# Patient Record
Sex: Female | Born: 1950 | Race: White | Hispanic: No | Smoking: Current every day smoker
Health system: Southern US, Community
[De-identification: ages and names within clinical notes are randomized; demographics above are authoritative.]

## PROBLEM LIST (undated history)

## (undated) DIAGNOSIS — B379 Candidiasis, unspecified: Secondary | ICD-10-CM

## (undated) DIAGNOSIS — E785 Hyperlipidemia, unspecified: Secondary | ICD-10-CM

## (undated) DIAGNOSIS — I10 Essential (primary) hypertension: Secondary | ICD-10-CM

## (undated) HISTORY — DX: Candidiasis, unspecified: B37.9

## (undated) HISTORY — DX: Hyperlipidemia, unspecified: E78.5

## (undated) HISTORY — PX: SPINE SURGERY: SHX786

## (undated) HISTORY — PX: ABDOMINAL HYSTERECTOMY: SHX81

## (undated) HISTORY — DX: Essential (primary) hypertension: I10

---

## 2000-11-15 ENCOUNTER — Other Ambulatory Visit: Admission: RE | Admit: 2000-11-15 | Discharge: 2000-11-15 | Payer: Self-pay | Admitting: Family Medicine

## 2001-12-22 ENCOUNTER — Encounter: Payer: Self-pay | Admitting: Family Medicine

## 2001-12-22 ENCOUNTER — Ambulatory Visit (HOSPITAL_COMMUNITY): Admission: RE | Admit: 2001-12-22 | Discharge: 2001-12-22 | Payer: Self-pay | Admitting: Family Medicine

## 2001-12-26 ENCOUNTER — Other Ambulatory Visit: Admission: RE | Admit: 2001-12-26 | Discharge: 2001-12-26 | Payer: Self-pay | Admitting: Family Medicine

## 2002-02-13 ENCOUNTER — Other Ambulatory Visit: Admission: RE | Admit: 2002-02-13 | Discharge: 2002-02-13 | Payer: Self-pay | Admitting: Family Medicine

## 2003-01-18 ENCOUNTER — Encounter: Payer: Self-pay | Admitting: Family Medicine

## 2003-01-18 ENCOUNTER — Ambulatory Visit (HOSPITAL_COMMUNITY): Admission: RE | Admit: 2003-01-18 | Discharge: 2003-01-18 | Payer: Self-pay | Admitting: Family Medicine

## 2003-01-21 ENCOUNTER — Other Ambulatory Visit: Admission: RE | Admit: 2003-01-21 | Discharge: 2003-01-21 | Payer: Self-pay | Admitting: Family Medicine

## 2003-01-24 ENCOUNTER — Encounter: Payer: Self-pay | Admitting: Family Medicine

## 2003-01-24 ENCOUNTER — Encounter: Admission: RE | Admit: 2003-01-24 | Discharge: 2003-01-24 | Payer: Self-pay | Admitting: Family Medicine

## 2004-01-28 ENCOUNTER — Encounter: Admission: RE | Admit: 2004-01-28 | Discharge: 2004-01-28 | Payer: Self-pay | Admitting: Family Medicine

## 2004-01-30 ENCOUNTER — Other Ambulatory Visit: Admission: RE | Admit: 2004-01-30 | Discharge: 2004-01-30 | Payer: Self-pay | Admitting: Family Medicine

## 2004-03-12 ENCOUNTER — Encounter: Admission: RE | Admit: 2004-03-12 | Discharge: 2004-03-12 | Payer: Self-pay | Admitting: Family Medicine

## 2004-03-17 ENCOUNTER — Ambulatory Visit: Admission: RE | Admit: 2004-03-17 | Discharge: 2004-03-17 | Payer: Self-pay | Admitting: Family Medicine

## 2005-02-23 ENCOUNTER — Encounter: Admission: RE | Admit: 2005-02-23 | Discharge: 2005-02-23 | Payer: Self-pay | Admitting: Family Medicine

## 2005-03-02 ENCOUNTER — Other Ambulatory Visit: Admission: RE | Admit: 2005-03-02 | Discharge: 2005-03-02 | Payer: Self-pay | Admitting: Family Medicine

## 2006-02-25 ENCOUNTER — Encounter: Admission: RE | Admit: 2006-02-25 | Discharge: 2006-02-25 | Payer: Self-pay | Admitting: Family Medicine

## 2007-03-17 ENCOUNTER — Encounter: Admission: RE | Admit: 2007-03-17 | Discharge: 2007-03-17 | Payer: Self-pay | Admitting: Family Medicine

## 2008-03-01 ENCOUNTER — Encounter: Admission: RE | Admit: 2008-03-01 | Discharge: 2008-03-01 | Payer: Self-pay | Admitting: Family Medicine

## 2008-04-03 ENCOUNTER — Ambulatory Visit (HOSPITAL_COMMUNITY): Admission: RE | Admit: 2008-04-03 | Discharge: 2008-04-04 | Payer: Self-pay | Admitting: Neurosurgery

## 2008-05-21 ENCOUNTER — Encounter: Admission: RE | Admit: 2008-05-21 | Discharge: 2008-05-21 | Payer: Self-pay | Admitting: Neurosurgery

## 2008-07-10 ENCOUNTER — Encounter: Admission: RE | Admit: 2008-07-10 | Discharge: 2008-07-10 | Payer: Self-pay | Admitting: Neurosurgery

## 2009-08-19 ENCOUNTER — Encounter: Admission: RE | Admit: 2009-08-19 | Discharge: 2009-08-19 | Payer: Self-pay | Admitting: Nephrology

## 2010-04-14 ENCOUNTER — Encounter: Admission: RE | Admit: 2010-04-14 | Discharge: 2010-04-14 | Payer: Self-pay | Admitting: Family Medicine

## 2010-10-25 ENCOUNTER — Encounter: Payer: Self-pay | Admitting: Neurosurgery

## 2011-02-16 NOTE — Op Note (Signed)
[name], [name]                 ACCOUNT NO.:  [id number]   MEDICAL RECORD NO.:  [id number]          PATIENT TYPE:  OIB   LOCATION:  3534                         FACILITY:  [facility]   PHYSICIAN:  Hilda Lias, M.D.   DATE OF BIRTH:  [birth year]   DATE OF PROCEDURE:  04/03/2008  DATE OF DISCHARGE:                               OPERATIVE REPORT   PREOPERATIVE DIAGNOSES:  C4-C5, C5-T6 spondylosis with chronic  radiculopathy.   POSTOPERATIVE DIAGNOSES:  C4-C5, C5-T6 spondylosis with chronic  radiculopathy.   PROCEDURE:  Anterior 4-5, 5-6 diskectomy, decompression of spinal cord,  interbody fusion with auto and allograft plate microscope.   SURGEON:  Hilda Lias, M.D.   ASSISTANT:  Danae Orleans. Venetia Maxon, MD.   CLINICAL HISTORY:  Stephanie Bolton is a 60 year old female complaining of neck  pain with both upper extremities associated with weakness.  X-ray shows  stenosis at the level of 4-5 and 5-6.  Surgery was advised in view of  failed conservative treatment.   PROCEDURE:  The patient was taken to the OR, and after intubation, the  left side of the neck was cleaned with DuraPrep.  Transverse incision  was made through the skin, subcutaneous tissue, platysma down to the  cervical area.  We found two anterior osteophytes.  Lateral cervical  spine showed that the needle was at 4-5.  From then on, we removed the  anterior osteophyte at 4-5 and 5-6 and we brought the microscope into  the area.  The anterior ligament which was less calcified was opened  using the drill.  We entered the disk space at 4-5 first which was quite  a bit of sclerotic.  We went to the posterior body of 4-5 and the  ligament was also incised in the midline.  Using the one 2 or 3-mm  Kerrison punch, decompression of the spinal cord as well post C5 nerve  root was achieved.  The same procedure was essentially done at the level  of L5-6 with good decompression.  Then two pieces of allograft, 7-mm  height, lordotic, with  autograft in the middle were inserted followed by  a plate using five screws.  Lateral cervical spine showed good position  of the graft at the plate.  We waited 5 minutes just to be assure that  we had good hemostasis.  Once this was accomplished, the wound was  closed with Vicryl and Steri-Strips.           ______________________________  Hilda Lias, M.D.     EB/MEDQ  D:  04/03/2008  T:  04/03/2008  Job:  [id number]

## 2011-07-01 LAB — BASIC METABOLIC PANEL
BUN: 2 — ABNORMAL LOW
Calcium: 9
GFR calc non Af Amer: >60
Glucose, Bld: 160 — ABNORMAL HIGH
Potassium: 3.2 — ABNORMAL LOW

## 2011-07-01 LAB — CBC
HCT: 41
MCHC: 34.3
Platelets: 301
RDW: 14.8

## 2011-10-04 ENCOUNTER — Ambulatory Visit: Payer: Managed Care, Other (non HMO)

## 2011-10-04 DIAGNOSIS — J209 Acute bronchitis, unspecified: Secondary | ICD-10-CM

## 2011-11-04 ENCOUNTER — Telehealth: Payer: Self-pay

## 2011-11-04 NOTE — Telephone Encounter (Signed)
.  [facility] PT IS REQUESTING A Z-PAC ANTIBOTICS - STILL NOT BETTER  LAST SEEN ON 10/04/11 CB [phone]

## 2011-11-04 NOTE — Telephone Encounter (Signed)
SPOKE WITH PT AND SHE STATES SHE IS A LOT BETTER THAN BEFORE BUT HER COUGH IS GETTING BAD AGAIN AND SHE HAS A LOT OF CONGESTION. I LET PT KNOW [facility] WORKS IN HER SYSTEM SEVEN DAYS AFTER THE LAST PILL. PT UNDERSTOOD BUT FEELS SHE FELT SHE WAITED TO CALL us BECAUSE SHE WAS GIVING IT TIME TO WORK. PLEASE ADVISE WHAT PT SHOULD DO NEXT. SHE USES [facility] IN [place]

## 2011-11-04 NOTE — Telephone Encounter (Signed)
Patient was last seen 1 month ago. Please have them start Mucinex and RTC for evaluation.  Stephanie Bolton

## 2011-11-04 NOTE — Telephone Encounter (Signed)
LMOM TO CB AND GET DETAILS ON HER CURRENT SXS

## 2011-11-05 NOTE — Telephone Encounter (Signed)
LMOM note from ryan

## 2012-03-01 ENCOUNTER — Other Ambulatory Visit: Payer: Self-pay | Admitting: Family Medicine

## 2012-03-01 DIAGNOSIS — Z1231 Encounter for screening mammogram for malignant neoplasm of breast: Secondary | ICD-10-CM

## 2012-03-29 ENCOUNTER — Ambulatory Visit
Admission: RE | Admit: 2012-03-29 | Discharge: 2012-03-29 | Disposition: A | Discharge status: Home / Self Care | Payer: Managed Care, Other (non HMO) | Source: Ambulatory Visit | Attending: Family Medicine | Admitting: Family Medicine

## 2012-03-29 DIAGNOSIS — Z1231 Encounter for screening mammogram for malignant neoplasm of breast: Secondary | ICD-10-CM

## 2013-02-16 ENCOUNTER — Other Ambulatory Visit (HOSPITAL_COMMUNITY)
Admission: RE | Admit: 2013-02-16 | Discharge: 2013-02-16 | Disposition: A | Discharge status: Home / Self Care | Payer: Managed Care, Other (non HMO) | Source: Ambulatory Visit | Attending: Gastroenterology | Admitting: Gastroenterology

## 2013-02-16 DIAGNOSIS — B379 Candidiasis, unspecified: Secondary | ICD-10-CM | POA: Insufficient documentation

## 2015-04-29 ENCOUNTER — Other Ambulatory Visit: Payer: Self-pay

## 2015-04-29 DIAGNOSIS — Z1231 Encounter for screening mammogram for malignant neoplasm of breast: Secondary | ICD-10-CM

## 2015-04-30 ENCOUNTER — Other Ambulatory Visit: Payer: Self-pay | Admitting: Family Medicine

## 2015-04-30 ENCOUNTER — Ambulatory Visit
Admission: RE | Admit: 2015-04-30 | Discharge: 2015-04-30 | Disposition: A | Discharge status: Home / Self Care | Payer: Managed Care, Other (non HMO) | Source: Ambulatory Visit | Attending: Family Medicine | Admitting: Family Medicine

## 2015-04-30 DIAGNOSIS — R200 Anesthesia of skin: Secondary | ICD-10-CM

## 2015-04-30 DIAGNOSIS — R52 Pain, unspecified: Secondary | ICD-10-CM

## 2015-04-30 DIAGNOSIS — M5489 Other dorsalgia: Secondary | ICD-10-CM

## 2015-04-30 DIAGNOSIS — R202 Paresthesia of skin: Secondary | ICD-10-CM

## 2015-05-05 ENCOUNTER — Ambulatory Visit
Admission: RE | Admit: 2015-05-05 | Discharge: 2015-05-05 | Disposition: A | Discharge status: Home / Self Care | Payer: Managed Care, Other (non HMO) | Source: Ambulatory Visit

## 2015-05-05 DIAGNOSIS — Z1231 Encounter for screening mammogram for malignant neoplasm of breast: Secondary | ICD-10-CM

## 2015-05-15 ENCOUNTER — Other Ambulatory Visit: Payer: Self-pay | Admitting: Neurosurgery

## 2015-05-15 DIAGNOSIS — M5415 Radiculopathy, thoracolumbar region: Secondary | ICD-10-CM

## 2015-05-15 DIAGNOSIS — M5412 Radiculopathy, cervical region: Secondary | ICD-10-CM

## 2015-05-23 ENCOUNTER — Ambulatory Visit
Admission: RE | Admit: 2015-05-23 | Discharge: 2015-05-23 | Disposition: A | Discharge status: Home / Self Care | Payer: Managed Care, Other (non HMO) | Source: Ambulatory Visit | Attending: Neurosurgery | Admitting: Neurosurgery

## 2015-05-23 VITALS — BP 123/71 | HR 87

## 2015-05-23 DIAGNOSIS — M5415 Radiculopathy, thoracolumbar region: Secondary | ICD-10-CM

## 2015-05-23 DIAGNOSIS — M5412 Radiculopathy, cervical region: Secondary | ICD-10-CM

## 2015-05-23 MED ORDER — IOHEXOL 300 MG/ML  SOLN
10.0000 mL | Freq: Once | INTRAMUSCULAR | Status: DC | PRN
  Administered 2015-05-23: 10 mL via INTRATHECAL

## 2015-05-23 MED ORDER — DIAZEPAM 5 MG PO TABS
5.0000 mg | ORAL_TABLET | Freq: Once | ORAL | Status: AC
  Administered 2015-05-23: 5 mg via ORAL

## 2015-05-23 MED ORDER — ONDANSETRON HCL 4 MG/2ML IJ SOLN
4.0000 mg | Freq: Once | INTRAMUSCULAR | Status: AC
  Administered 2015-05-23: 4 mg via INTRAMUSCULAR

## 2015-05-23 MED ORDER — MEPERIDINE HCL 100 MG/ML IJ SOLN
75.0000 mg | Freq: Once | INTRAMUSCULAR | Status: AC
  Administered 2015-05-23: 75 mg via INTRAMUSCULAR

## 2015-05-23 NOTE — Discharge Instructions (Signed)
Myelogram Discharge Instructions  1. Go home and rest quietly for the next 24 hours.  It is important to lie flat for the next 24 hours.  Get up only to go to the restroom.  You may lie in the bed or on a couch on your back, your stomach, your left side or your right side.  You may have one pillow under your head.  You may have pillows between your knees while you are on your side or under your knees while you are on your back.  2. DO NOT drive today.  Recline the seat as far back as it will go, while still wearing your seat belt, on the way home.  3. You may get up to go to the bathroom as needed.  You may sit up for 10 minutes to eat.  You may resume your normal diet and medications unless otherwise indicated.  Drink lots of extra fluids today and tomorrow.  4. The incidence of headache, nausea, or vomiting is about 5% (one in 20 patients).  If you develop a headache, lie flat and drink plenty of fluids until the headache goes away.  Caffeinated beverages may be helpful.  If you develop severe nausea and vomiting or a headache that does not go away with flat bed rest, call [phone].  5. You may resume normal activities after your 24 hours of bed rest is over; however, do not exert yourself strongly or do any heavy lifting tomorrow. If when you get up you have a headache when standing, go back to bed and force fluids for another 24 hours.  6. Call your physician for a follow-up appointment.  The results of your myelogram will be sent directly to your physician by the following day.  7. If you have any questions or if complications develop after you arrive home, please call [phone].  Discharge instructions have been explained to the patient.  The patient, or the person responsible for the patient, fully understands these instructions.  May resume Tramadol on Aug. 20, 2016, after 8:30 am.

## 2016-03-11 IMAGING — CR DG MYELOGRAPHY LUMBAR INJ MULTI REGION
7 series · 7 of 7 positions shown · non-contrast
Comparison: none

CLINICAL DATA: Low back pain. Neck pain. Symptom duration for
several months.
TECHNIQUE: Contiguous axial images were obtained through the Cervical and
Lumbar spine after the intrathecal infusion of infusion. Coronal and
sagittal reconstructions were obtained of the axial image sets.

[w cervical spine lat]
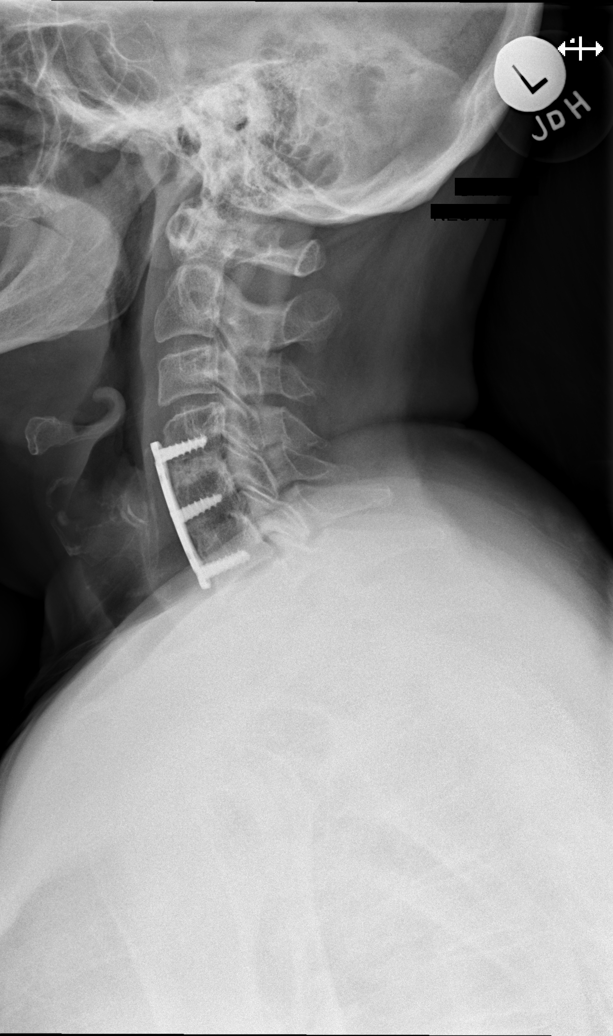

[w cervical spine flexion]
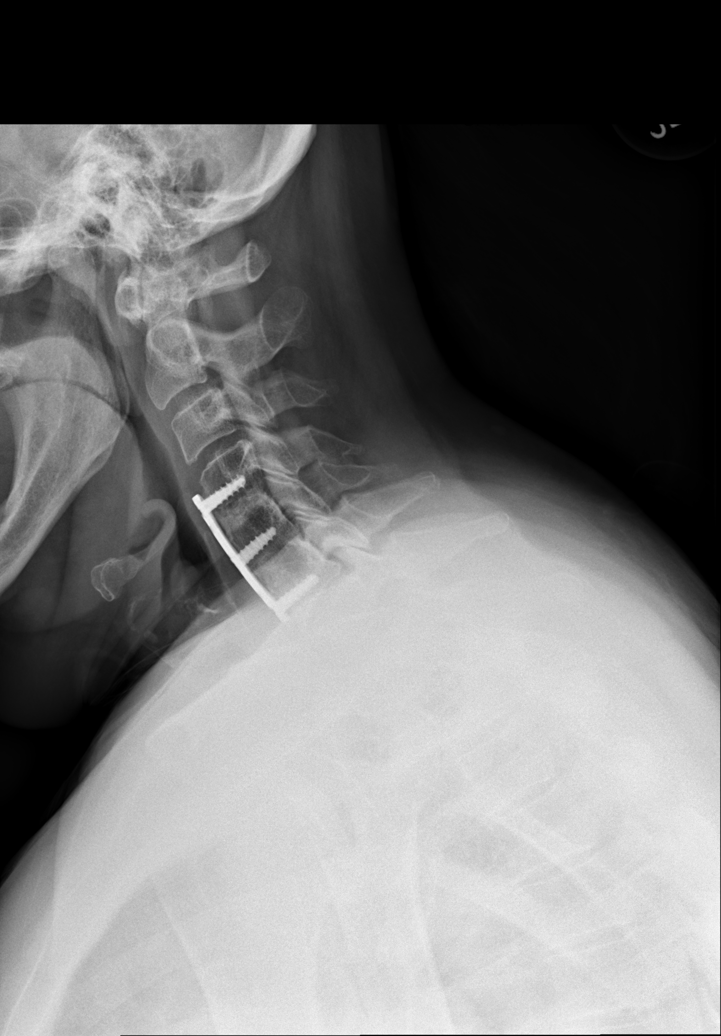

[w cervical spine extension]
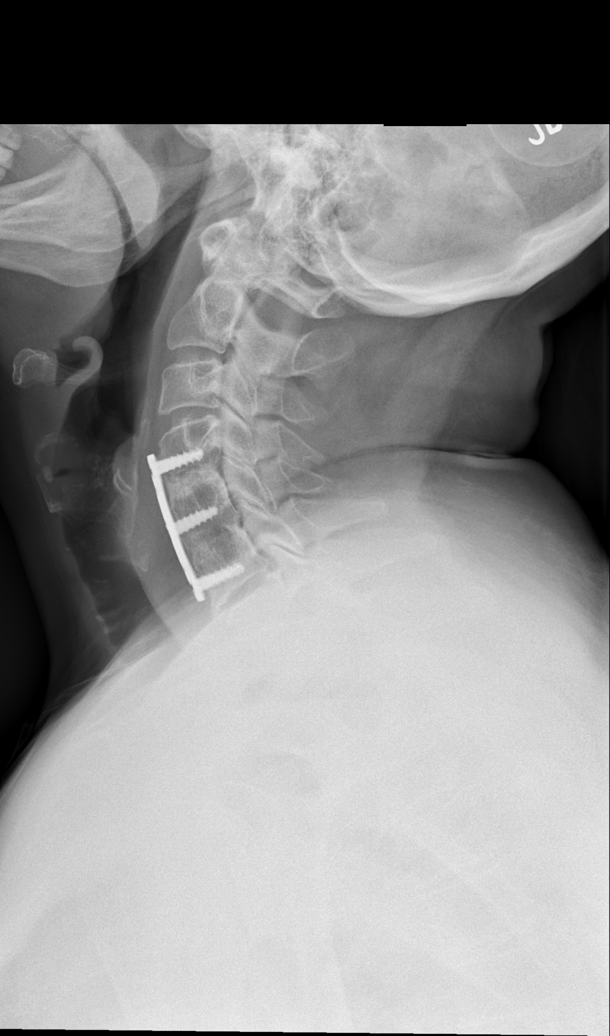

[w lumbar spine ap]
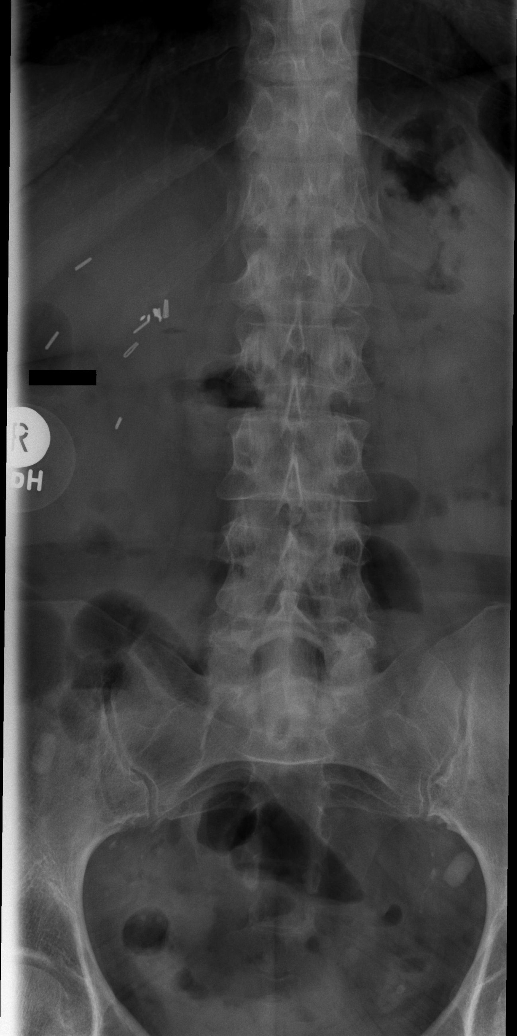

[w lumbar spine lat]
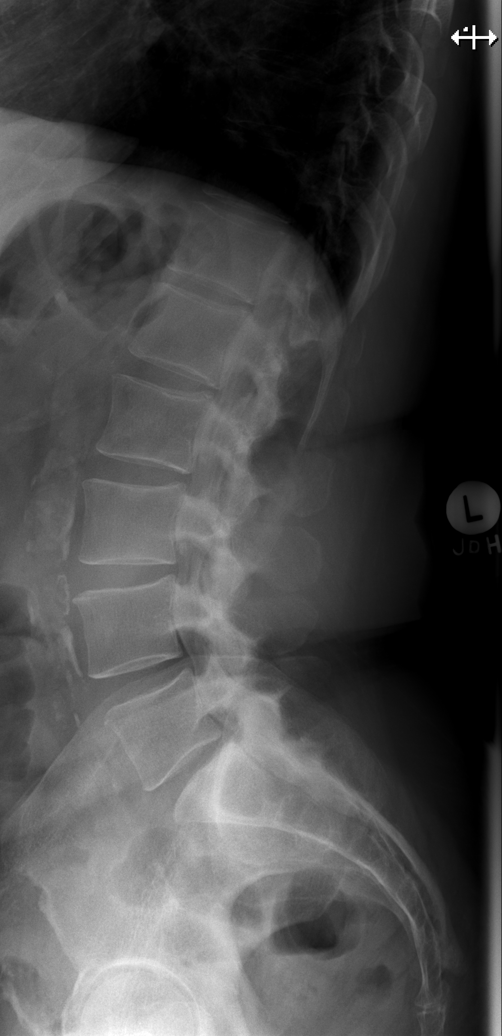

[w lumbar spine flexion]
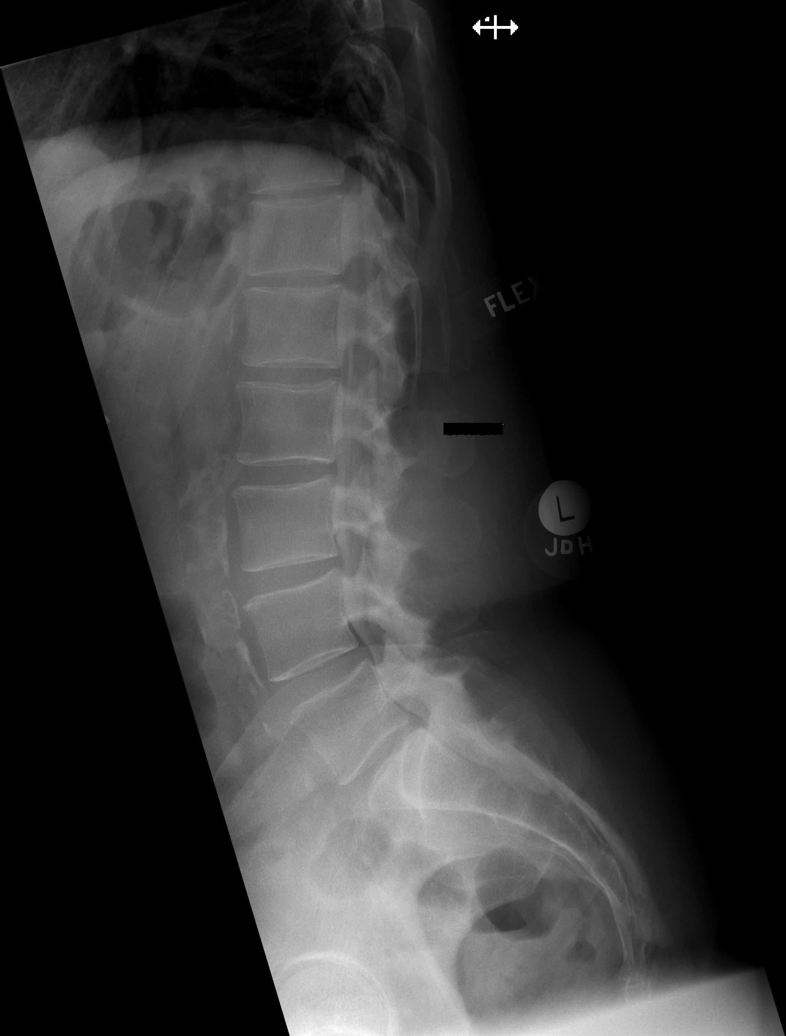

[w lumbar spine extension]
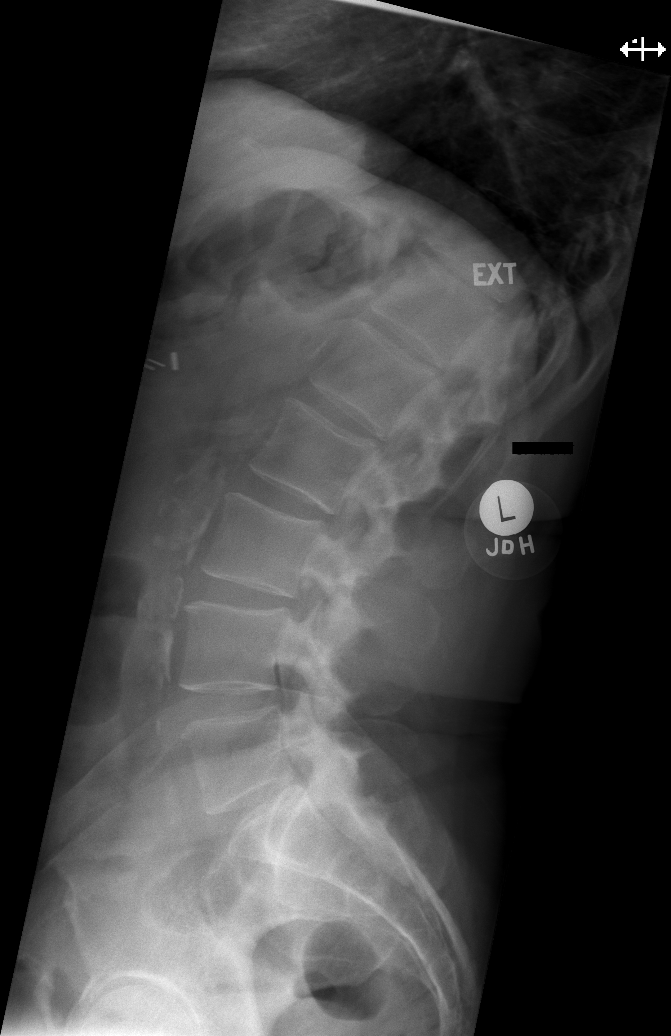

[7 of 7 positions shown; findings below may reference images not displayed]

FLUOROSCOPY TIME:  1 minutes and 58 seconds.

PROCEDURE:
LUMBAR PUNCTURE FOR CERVICAL AND LUMBAR MYELOGRAM

CERVICAL AND LUMBAR MYELOGRAM

CT CERVICAL MYELOGRAM

CT LUMBAR MYELOGRAM

After thorough discussion of risks and benefits of the procedure
including bleeding, infection, injury to nerves, blood vessels,
adjacent structures as well as headache and CSF leak, written and
oral informed consent was obtained. Consent was obtained by Dr. Brzeg
Lugermo.

Patient was positioned prone on the fluoroscopy table. Local
anesthesia was provided with 1% lidocaine without epinephrine after
prepped and draped in the usual sterile fashion. Puncture was
performed at L3-L4 using a 3 1/2 inch 22-gauge spinal needle via
midline approach. Using a single pass through the dura, the needle
was placed within the thecal sac, with return of clear CSF. 10 mL of
0mnipaque-3EE was injected into the thecal sac, with normal
opacification of the nerve roots and cauda equina consistent with
free flow within the subarachnoid space. The patient was then moved
to the trendelenburg position and contrast flowed into the Cervical
spine region.

I personally performed the lumbar puncture and administered the
intrathecal contrast. I also personally supervised acquisition of
the myelogram images.
FINDINGS: CERVICAL AND LUMBAR MYELOGRAM FINDINGS:

CERVICAL: Good opacification cervical subarachnoid space. Prior C4
through C6 fusion appears solid. Intact anterior plate and screws.
No visible spinal stenosis. Slight disc space narrowing at C6-C7
with central disc osteophyte complex. Standing flexion extension
demonstrate anatomic alignment without dynamic instability. No cord
compression.

LUMBAR: Good opacification lumbar subarachnoid space. Anatomic
alignment. No significant extradural defect. No nerve root cut off
or spinal stenosis.

Standing flexion extension demonstrate no dynamic instability. Mild
aortic atherosclerosis.

CT CERVICAL MYELOGRAM FINDINGS:

No prevertebral or paraspinous masses.  Anatomic alignment.

The individual disc spaces were examined as follows:

C2-3:  Normal.

C3-4: Mild bulge. RIGHT-sided uncinate spurring. Slight effacement
RIGHT C4 nerve root, does not correspond to a myelographic defect.

C4-5: Solid fusion. Slight residual uncinate spurring on the RIGHT
without significant foraminal narrowing.

C5-6:  Solid fusion.  No residual impingement.

C6-7: Slight disc space narrowing and central disc osteophyte
complex. A significant myelographic defect is not correlated.

C7-T1:  Unremarkable.

CT LUMBAR MYELOGRAM FINDINGS:

No prevertebral or paraspinous masses. No worrisome osseous lesions.
Large Schmorl's node projects inferiorly from L5-S1. Normal conus.
Non aneurysmal atherosclerotic calcification of the aorta. Surgical
clips RIGHT upper quadrant.

L1-L2:  Unremarkable.

L2-L3:  Unremarkable.

L3-L4: Mild stenosis related to congenital short pedicles. Slight
facet arthropathy. No impingement.

L4-L5: Slight stenosis related to mild bulge, congenital short
pedicles, and mild facet arthropathy. No impingement.

L5-S1:  Mild bulge.  No impingement.
IMPRESSION: Minor lumbar spondylosis. No areas of impingement or dynamic
instability.

Solid C4-C6 fusion. Minor adjacent segment disease at C6-7 without
myelographic defect or definite impingement. No dynamic instability.

## 2018-02-07 ENCOUNTER — Telehealth: Payer: Self-pay | Admitting: Acute Care

## 2018-02-07 DIAGNOSIS — F17210 Nicotine dependence, cigarettes, uncomplicated: Principal | ICD-10-CM

## 2018-02-07 DIAGNOSIS — Z122 Encounter for screening for malignant neoplasm of respiratory organs: Secondary | ICD-10-CM

## 2018-02-10 NOTE — Telephone Encounter (Signed)
Spoke with pt and scheduled SDMV 02/22/18 11:30 CT ordered Nothing further needed

## 2018-02-22 ENCOUNTER — Encounter: Payer: Self-pay | Admitting: Acute Care

## 2018-02-22 ENCOUNTER — Ambulatory Visit (INDEPENDENT_AMBULATORY_CARE_PROVIDER_SITE_OTHER): Payer: Medicare Other | Admitting: Acute Care

## 2018-02-22 ENCOUNTER — Ambulatory Visit (INDEPENDENT_AMBULATORY_CARE_PROVIDER_SITE_OTHER)
Admission: RE | Admit: 2018-02-22 | Discharge: 2018-02-22 | Disposition: A | Discharge status: Home / Self Care | Payer: Medicare Other | Source: Ambulatory Visit | Attending: Acute Care | Admitting: Acute Care

## 2018-02-22 DIAGNOSIS — F17210 Nicotine dependence, cigarettes, uncomplicated: Secondary | ICD-10-CM

## 2018-02-22 DIAGNOSIS — Z122 Encounter for screening for malignant neoplasm of respiratory organs: Secondary | ICD-10-CM

## 2018-02-22 NOTE — Progress Notes (Signed)
Shared Decision Making Visit Lung Cancer Screening Program [phone])   Eligibility:  Age 67 y.o.  Pack Years Smoking History Calculation 49 pack year smoking history (# packs/per year x # years smoked)  Recent History of coughing up blood  no  Unexplained weight loss? no ( >Than 15 pounds within the last 6 months )  Prior History Lung / other cancer no (Diagnosis within the last 5 years already requiring surveillance chest CT Scans).  Smoking Status Current Smoker  Former Smokers: Years since quit: NA  Quit Date: NA  Visit Components:  Discussion included one or more decision making aids. yes  Discussion included risk/benefits of screening. yes  Discussion included potential follow up diagnostic testing for abnormal scans. yes  Discussion included meaning and risk of over diagnosis. yes  Discussion included meaning and risk of False Positives. yes  Discussion included meaning of total radiation exposure. yes  Counseling Included:  Importance of adherence to annual lung cancer LDCT screening. yes  Impact of comorbidities on ability to participate in the program. yes  Ability and willingness to under diagnostic treatment. yes  Smoking Cessation Counseling:  Current Smokers:   Discussed importance of smoking cessation. yes  Information about tobacco cessation classes and interventions provided to patient. yes  Patient provided with "ticket" for LDCT Scan. yes  Symptomatic Patient. no  Counseling  Diagnosis Code: Tobacco Use Z72.0  Asymptomatic Patient yes  Counseling (Intermediate counseling: > three minutes counseling) Y3016  Former Smokers:   Discussed the importance of maintaining cigarette abstinence. yes  Diagnosis Code: Personal History of Nicotine Dependence. W10.932  Information about tobacco cessation classes and interventions provided to patient. Yes  Patient provided with "ticket" for LDCT Scan. yes  Written Order for Lung Cancer  Screening with LDCT placed in Epic. Yes (CT Chest Lung Cancer Screening Low Dose W/O CM) [id number] Z12.2-Screening of respiratory organs Z87.891-Personal history of nicotine dependence  I have spent 25 minutes of face to face time with Stephanie Bolton discussing the risks and benefits of lung cancer screening. We viewed a power point together that explained in detail the above noted topics. We paused at intervals to allow for questions to be asked and answered to ensure understanding.We discussed that the single most powerful action that she can take to decrease her risk of developing lung cancer is to quit smoking. We discussed whether or not she is ready to commit to setting a quit date. We discussed options for tools to aid in quitting smoking including nicotine replacement therapy, non-nicotine medications, support groups, Quit Smart classes, and behavior modification. We discussed that often times setting smaller, more achievable goals, such as eliminating 1 cigarette a day for a week and then 2 cigarettes a day for a week can be helpful in slowly decreasing the number of cigarettes smoked. This allows for a sense of accomplishment as well as providing a clinical benefit. I gave her the " Be Stronger Than Your Excuses" card with contact information for community resources, classes, free nicotine replacement therapy, and access to mobile apps, text messaging, and on-line smoking cessation help. I have also given her my card and contact information in the event she needs to contact me. We discussed the time and location of the scan, and that either Stephanie Miyamoto RN or I will call with the results within 24-48 hours of receiving them. I have offered her  a copy of the power point we viewed  as a resource in the event they need reinforcement of  the concepts we discussed today in the office. The patient verbalized understanding of all of  the above and had no further questions upon leaving the office. They have my  contact information in the event they have any further questions.  I spent 4 minutes counseling on smoking cessation and the health risks of continued tobacco abuse.  I explained to the patient that there has been a high incidence of coronary artery disease noted on these exams. I explained that this is a non-gated exam therefore degree or severity cannot be determined. This patient is not on statin therapy. I have asked the patient to follow-up with their PCP regarding any incidental finding of coronary artery disease and management with diet or medication as their PCP  feels is clinically indicated. The patient verbalized understanding of the above and had no further questions upon completion of the visit.      Bevelyn Ngo, NP 02/22/2018 12:36 PM

## 2018-03-02 ENCOUNTER — Other Ambulatory Visit: Payer: Self-pay | Admitting: Acute Care

## 2018-03-02 DIAGNOSIS — F17210 Nicotine dependence, cigarettes, uncomplicated: Principal | ICD-10-CM

## 2018-03-02 DIAGNOSIS — Z122 Encounter for screening for malignant neoplasm of respiratory organs: Secondary | ICD-10-CM

## 2019-03-27 ENCOUNTER — Other Ambulatory Visit: Payer: Self-pay | Admitting: *Deleted

## 2019-03-27 DIAGNOSIS — Z87891 Personal history of nicotine dependence: Secondary | ICD-10-CM

## 2019-03-27 DIAGNOSIS — Z122 Encounter for screening for malignant neoplasm of respiratory organs: Secondary | ICD-10-CM

## 2019-03-27 DIAGNOSIS — F17210 Nicotine dependence, cigarettes, uncomplicated: Secondary | ICD-10-CM

## 2019-08-22 ENCOUNTER — Other Ambulatory Visit: Payer: Self-pay | Admitting: Family Medicine

## 2019-08-22 DIAGNOSIS — F17210 Nicotine dependence, cigarettes, uncomplicated: Secondary | ICD-10-CM

## 2020-01-18 ENCOUNTER — Other Ambulatory Visit (HOSPITAL_COMMUNITY): Payer: Self-pay | Admitting: Respiratory Therapy

## 2020-01-18 DIAGNOSIS — R0602 Shortness of breath: Secondary | ICD-10-CM

## 2020-01-18 DIAGNOSIS — F17210 Nicotine dependence, cigarettes, uncomplicated: Secondary | ICD-10-CM

## 2020-02-08 ENCOUNTER — Other Ambulatory Visit: Payer: Self-pay

## 2020-02-08 ENCOUNTER — Other Ambulatory Visit (HOSPITAL_COMMUNITY)
Admission: RE | Admit: 2020-02-08 | Discharge: 2020-02-08 | Disposition: A | Discharge status: Home / Self Care | Payer: Medicare Other | Source: Ambulatory Visit | Attending: Family Medicine | Admitting: Family Medicine

## 2020-02-08 DIAGNOSIS — Z01812 Encounter for preprocedural laboratory examination: Secondary | ICD-10-CM | POA: Diagnosis present

## 2020-02-08 DIAGNOSIS — Z20822 Contact with and (suspected) exposure to covid-19: Secondary | ICD-10-CM | POA: Insufficient documentation

## 2020-02-09 LAB — SARS CORONAVIRUS 2 (TAT 6-24 HRS): SARS Coronavirus 2: NEGATIVE

## 2020-02-12 ENCOUNTER — Ambulatory Visit (HOSPITAL_COMMUNITY)
Admission: RE | Admit: 2020-02-12 | Discharge: 2020-02-12 | Disposition: A | Discharge status: Home / Self Care | Payer: Medicare Other | Source: Ambulatory Visit | Attending: Family Medicine | Admitting: Family Medicine

## 2020-02-12 DIAGNOSIS — R0602 Shortness of breath: Secondary | ICD-10-CM | POA: Insufficient documentation

## 2020-02-12 DIAGNOSIS — F17210 Nicotine dependence, cigarettes, uncomplicated: Secondary | ICD-10-CM | POA: Diagnosis present

## 2020-02-12 LAB — PULMONARY FUNCTION TEST
DL/VA % pred: 69 %
DL/VA: 2.91 ml/min/mmHg/L
DLCO unc % pred: 48 %
DLCO unc: 8.93 ml/min/mmHg
FEF 25-75 Post: 0.85 L/sec
FEF 25-75 Pre: 0.62 L/sec
FEF2575-%Change-Post: 37 %
FEF2575-%Pred-Post: 46 %
FEF2575-%Pred-Pre: 33 %
FEV1-%Change-Post: 7 %
FEV1-%Pred-Post: 55 %
FEV1-%Pred-Pre: 51 %
FEV1-Post: 1.16 L
FEV1-Pre: 1.08 L
FEV1FVC-%Change-Post: -8 %
FEV1FVC-%Pred-Pre: 87 %
FEV6-%Change-Post: 17 %
FEV6-%Pred-Post: 71 %
FEV6-%Pred-Pre: 60 %
FEV6-Post: 1.9 L
FEV6-Pre: 1.62 L
FEV6FVC-%Change-Post: 0 %
FEV6FVC-%Pred-Post: 104 %
FEV6FVC-%Pred-Pre: 104 %
FVC-%Change-Post: 17 %
FVC-%Pred-Post: 68 %
FVC-%Pred-Pre: 57 %
FVC-Post: 1.9 L
FVC-Pre: 1.62 L
Post FEV1/FVC ratio: 61 %
Post FEV6/FVC ratio: 100 %
Pre FEV1/FVC ratio: 67 %
Pre FEV6/FVC Ratio: 100 %
RV % pred: 202 %
RV: 4.2 L
TLC % pred: 123 %
TLC: 5.87 L

## 2020-02-12 MED ORDER — ALBUTEROL SULFATE (2.5 MG/3ML) 0.083% IN NEBU
2.5000 mg | INHALATION_SOLUTION | Freq: Once | RESPIRATORY_TRACT | Status: AC
  Administered 2020-02-12: 2.5 mg via RESPIRATORY_TRACT

## 2020-02-18 ENCOUNTER — Other Ambulatory Visit: Payer: Self-pay

## 2020-02-18 ENCOUNTER — Ambulatory Visit (HOSPITAL_COMMUNITY)
Admission: RE | Admit: 2020-02-18 | Discharge: 2020-02-18 | Disposition: A | Discharge status: Home / Self Care | Payer: Medicare Other | Source: Ambulatory Visit | Attending: Acute Care | Admitting: Acute Care

## 2020-02-18 DIAGNOSIS — Z87891 Personal history of nicotine dependence: Secondary | ICD-10-CM | POA: Insufficient documentation

## 2020-02-18 DIAGNOSIS — F17200 Nicotine dependence, unspecified, uncomplicated: Secondary | ICD-10-CM | POA: Diagnosis present

## 2020-02-18 DIAGNOSIS — Z122 Encounter for screening for malignant neoplasm of respiratory organs: Secondary | ICD-10-CM | POA: Diagnosis present

## 2020-02-18 DIAGNOSIS — F17210 Nicotine dependence, cigarettes, uncomplicated: Secondary | ICD-10-CM

## 2020-02-21 ENCOUNTER — Other Ambulatory Visit: Payer: Self-pay | Admitting: *Deleted

## 2020-02-21 DIAGNOSIS — F17210 Nicotine dependence, cigarettes, uncomplicated: Secondary | ICD-10-CM

## 2020-02-21 NOTE — Progress Notes (Signed)
Please call patient and let them  know their  low dose Ct was read as a Lung RADS 1, negative study: no nodules or definitely benign nodules. Radiology recommendation is for a repeat LDCT in 12 months.  .Please let them  know we will order and schedule their  annual screening scan for 02/2021. Please let them  know there was notation of CAD on their  scan.  Please remind the patient  that this is a non-gated exam therefore degree or severity of disease  cannot be determined. Please have them  follow up with their PCP regarding potential risk factor modification, dietary therapy or pharmacologic therapy if clinically indicated. Pt.  is  currently on statin therapy. Please place order for annual  screening scan for  02/2021 and fax results to PCP. Thanks so much. 

## 2021-01-20 DIAGNOSIS — R0600 Dyspnea, unspecified: Secondary | ICD-10-CM | POA: Diagnosis not present

## 2021-01-20 DIAGNOSIS — R062 Wheezing: Secondary | ICD-10-CM | POA: Diagnosis not present

## 2021-01-20 DIAGNOSIS — R000 Tachycardia, unspecified: Secondary | ICD-10-CM | POA: Diagnosis not present

## 2021-01-20 DIAGNOSIS — M1990 Unspecified osteoarthritis, unspecified site: Secondary | ICD-10-CM | POA: Diagnosis not present

## 2021-01-20 DIAGNOSIS — K219 Gastro-esophageal reflux disease without esophagitis: Secondary | ICD-10-CM | POA: Diagnosis not present

## 2021-01-20 DIAGNOSIS — I10 Essential (primary) hypertension: Secondary | ICD-10-CM | POA: Diagnosis not present

## 2021-01-20 DIAGNOSIS — R7989 Other specified abnormal findings of blood chemistry: Secondary | ICD-10-CM | POA: Insufficient documentation

## 2021-01-20 DIAGNOSIS — Z79899 Other long term (current) drug therapy: Secondary | ICD-10-CM | POA: Diagnosis not present

## 2021-01-20 DIAGNOSIS — J9620 Acute and chronic respiratory failure, unspecified whether with hypoxia or hypercapnia: Secondary | ICD-10-CM | POA: Diagnosis not present

## 2021-01-20 DIAGNOSIS — Z720 Tobacco use: Secondary | ICD-10-CM | POA: Diagnosis not present

## 2021-01-20 DIAGNOSIS — G8929 Other chronic pain: Secondary | ICD-10-CM | POA: Diagnosis not present

## 2021-01-20 DIAGNOSIS — Z9109 Other allergy status, other than to drugs and biological substances: Secondary | ICD-10-CM | POA: Diagnosis not present

## 2021-01-20 DIAGNOSIS — R059 Cough, unspecified: Secondary | ICD-10-CM | POA: Diagnosis not present

## 2021-01-20 DIAGNOSIS — Z20822 Contact with and (suspected) exposure to covid-19: Secondary | ICD-10-CM | POA: Diagnosis not present

## 2021-01-20 DIAGNOSIS — J441 Chronic obstructive pulmonary disease with (acute) exacerbation: Secondary | ICD-10-CM | POA: Diagnosis not present

## 2021-01-20 DIAGNOSIS — J439 Emphysema, unspecified: Secondary | ICD-10-CM | POA: Insufficient documentation

## 2021-01-20 DIAGNOSIS — R0602 Shortness of breath: Secondary | ICD-10-CM | POA: Diagnosis not present

## 2021-01-20 DIAGNOSIS — I2510 Atherosclerotic heart disease of native coronary artery without angina pectoris: Secondary | ICD-10-CM | POA: Diagnosis not present

## 2021-01-20 DIAGNOSIS — J449 Chronic obstructive pulmonary disease, unspecified: Secondary | ICD-10-CM | POA: Diagnosis not present

## 2021-01-20 DIAGNOSIS — Z881 Allergy status to other antibiotic agents status: Secondary | ICD-10-CM | POA: Diagnosis not present

## 2021-01-20 DIAGNOSIS — Z66 Do not resuscitate: Secondary | ICD-10-CM | POA: Diagnosis not present

## 2021-01-20 DIAGNOSIS — F17210 Nicotine dependence, cigarettes, uncomplicated: Secondary | ICD-10-CM | POA: Diagnosis not present

## 2021-01-20 DIAGNOSIS — B3781 Candidal esophagitis: Secondary | ICD-10-CM | POA: Diagnosis not present

## 2021-01-29 DIAGNOSIS — Z1159 Encounter for screening for other viral diseases: Secondary | ICD-10-CM | POA: Diagnosis not present

## 2021-01-29 DIAGNOSIS — Z13820 Encounter for screening for osteoporosis: Secondary | ICD-10-CM | POA: Diagnosis not present

## 2021-01-29 DIAGNOSIS — Z7689 Persons encountering health services in other specified circumstances: Secondary | ICD-10-CM | POA: Diagnosis not present

## 2021-01-29 DIAGNOSIS — Z780 Asymptomatic menopausal state: Secondary | ICD-10-CM | POA: Diagnosis not present

## 2021-01-29 DIAGNOSIS — F17200 Nicotine dependence, unspecified, uncomplicated: Secondary | ICD-10-CM | POA: Diagnosis not present

## 2021-01-29 DIAGNOSIS — J449 Chronic obstructive pulmonary disease, unspecified: Secondary | ICD-10-CM | POA: Diagnosis not present

## 2021-01-29 DIAGNOSIS — Z23 Encounter for immunization: Secondary | ICD-10-CM | POA: Diagnosis not present

## 2021-01-29 DIAGNOSIS — I10 Essential (primary) hypertension: Secondary | ICD-10-CM | POA: Diagnosis not present

## 2021-01-29 DIAGNOSIS — Z13220 Encounter for screening for lipoid disorders: Secondary | ICD-10-CM | POA: Diagnosis not present

## 2021-01-29 DIAGNOSIS — Z1231 Encounter for screening mammogram for malignant neoplasm of breast: Secondary | ICD-10-CM | POA: Diagnosis not present

## 2021-01-29 DIAGNOSIS — F17210 Nicotine dependence, cigarettes, uncomplicated: Secondary | ICD-10-CM | POA: Diagnosis not present

## 2021-02-02 ENCOUNTER — Other Ambulatory Visit: Payer: Self-pay | Admitting: Student

## 2021-02-02 DIAGNOSIS — Z1231 Encounter for screening mammogram for malignant neoplasm of breast: Secondary | ICD-10-CM

## 2021-03-05 DIAGNOSIS — I10 Essential (primary) hypertension: Secondary | ICD-10-CM | POA: Diagnosis not present

## 2021-03-05 DIAGNOSIS — G8929 Other chronic pain: Secondary | ICD-10-CM | POA: Diagnosis not present

## 2021-03-05 DIAGNOSIS — M5441 Lumbago with sciatica, right side: Secondary | ICD-10-CM | POA: Diagnosis not present

## 2021-03-05 DIAGNOSIS — F17200 Nicotine dependence, unspecified, uncomplicated: Secondary | ICD-10-CM | POA: Diagnosis not present

## 2021-03-25 ENCOUNTER — Ambulatory Visit
Admission: RE | Admit: 2021-03-25 | Discharge: 2021-03-25 | Disposition: A | Discharge status: Home / Self Care | Payer: Medicare Other | Source: Ambulatory Visit | Attending: Student | Admitting: Student

## 2021-03-25 ENCOUNTER — Other Ambulatory Visit: Payer: Self-pay

## 2021-03-25 DIAGNOSIS — Z1231 Encounter for screening mammogram for malignant neoplasm of breast: Secondary | ICD-10-CM | POA: Diagnosis not present

## 2021-03-31 DIAGNOSIS — J439 Emphysema, unspecified: Secondary | ICD-10-CM | POA: Diagnosis not present

## 2021-03-31 DIAGNOSIS — I700 Atherosclerosis of aorta: Secondary | ICD-10-CM | POA: Diagnosis not present

## 2021-03-31 DIAGNOSIS — Z122 Encounter for screening for malignant neoplasm of respiratory organs: Secondary | ICD-10-CM | POA: Diagnosis not present

## 2021-03-31 DIAGNOSIS — Z87891 Personal history of nicotine dependence: Secondary | ICD-10-CM | POA: Diagnosis not present

## 2021-03-31 DIAGNOSIS — F17210 Nicotine dependence, cigarettes, uncomplicated: Secondary | ICD-10-CM | POA: Diagnosis not present

## 2021-04-08 DIAGNOSIS — J449 Chronic obstructive pulmonary disease, unspecified: Secondary | ICD-10-CM | POA: Diagnosis not present

## 2021-04-08 DIAGNOSIS — E785 Hyperlipidemia, unspecified: Secondary | ICD-10-CM | POA: Diagnosis not present

## 2021-04-08 DIAGNOSIS — I10 Essential (primary) hypertension: Secondary | ICD-10-CM | POA: Diagnosis not present

## 2021-04-08 DIAGNOSIS — F17200 Nicotine dependence, unspecified, uncomplicated: Secondary | ICD-10-CM | POA: Diagnosis not present

## 2021-04-29 DIAGNOSIS — J984 Other disorders of lung: Secondary | ICD-10-CM | POA: Diagnosis not present

## 2021-04-29 DIAGNOSIS — J439 Emphysema, unspecified: Secondary | ICD-10-CM | POA: Diagnosis not present

## 2021-05-11 DIAGNOSIS — H25811 Combined forms of age-related cataract, right eye: Secondary | ICD-10-CM | POA: Diagnosis not present

## 2021-05-11 DIAGNOSIS — H02834 Dermatochalasis of left upper eyelid: Secondary | ICD-10-CM | POA: Diagnosis not present

## 2021-05-11 DIAGNOSIS — H40059 Ocular hypertension, unspecified eye: Secondary | ICD-10-CM | POA: Diagnosis not present

## 2021-05-11 DIAGNOSIS — Z961 Presence of intraocular lens: Secondary | ICD-10-CM | POA: Diagnosis not present

## 2021-05-11 DIAGNOSIS — H353132 Nonexudative age-related macular degeneration, bilateral, intermediate dry stage: Secondary | ICD-10-CM | POA: Diagnosis not present

## 2021-05-11 DIAGNOSIS — H31002 Unspecified chorioretinal scars, left eye: Secondary | ICD-10-CM | POA: Diagnosis not present

## 2021-05-11 DIAGNOSIS — H26492 Other secondary cataract, left eye: Secondary | ICD-10-CM | POA: Diagnosis not present

## 2021-05-11 DIAGNOSIS — H527 Unspecified disorder of refraction: Secondary | ICD-10-CM | POA: Diagnosis not present

## 2021-05-11 DIAGNOSIS — H02831 Dermatochalasis of right upper eyelid: Secondary | ICD-10-CM | POA: Diagnosis not present

## 2021-07-07 DIAGNOSIS — H25811 Combined forms of age-related cataract, right eye: Secondary | ICD-10-CM | POA: Insufficient documentation

## 2021-08-26 DIAGNOSIS — E785 Hyperlipidemia, unspecified: Secondary | ICD-10-CM | POA: Insufficient documentation

## 2022-06-02 ENCOUNTER — Encounter: Payer: Self-pay | Admitting: Family Medicine

## 2022-06-02 ENCOUNTER — Ambulatory Visit (INDEPENDENT_AMBULATORY_CARE_PROVIDER_SITE_OTHER): Payer: Medicare Other | Admitting: Family Medicine

## 2022-06-02 VITALS — BP 97/66 | HR 88 | Temp 98.2°F | Ht 62.0 in | Wt 129.0 lb

## 2022-06-02 DIAGNOSIS — M159 Polyosteoarthritis, unspecified: Secondary | ICD-10-CM

## 2022-06-02 DIAGNOSIS — B370 Candidal stomatitis: Secondary | ICD-10-CM

## 2022-06-02 DIAGNOSIS — R7989 Other specified abnormal findings of blood chemistry: Secondary | ICD-10-CM

## 2022-06-02 DIAGNOSIS — I10 Essential (primary) hypertension: Secondary | ICD-10-CM

## 2022-06-02 DIAGNOSIS — B3781 Candidal esophagitis: Secondary | ICD-10-CM | POA: Diagnosis not present

## 2022-06-02 MED ORDER — MELOXICAM 7.5 MG PO TABS: 7.5000 mg | ORAL_TABLET | Freq: Every day | ORAL | 0 refills | Status: DC

## 2022-06-02 MED ORDER — FLUCONAZOLE 150 MG PO TABS: ORAL_TABLET | ORAL | 0 refills | Status: AC

## 2022-06-02 NOTE — Progress Notes (Signed)
Subjective:  Patient ID: Stephanie Bolton, female    DOB: 18-Nov-1950, 71 y.o.   MRN: [id number]  Patient Care Team: Baruch Gouty, FNP as PCP - General (Family Medicine)   Chief Complaint:  New Patient (Initial Visit) (Main complaint: esophagitis)   HPI: Stephanie Bolton is a 71 y.o. female presenting on 06/02/2022 for New Patient (Initial Visit) (Main complaint: esophagitis)   Pt presents today to establish care with new PCP. She was formerly followed by Dr. Sabra Heck with [facility]. She states she is still followed by [facility] for pain management. She states she has chronic arthritic pain and fibromyalgia. States despite treatment with hydrocodone, she continues to have multiple joint pain and myalgias. [facility] was going to start gabapentin, pt declined. 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 She states the esophagitis is her biggest concern and reason for visit today. She has been evaluated by GI and underwent several EGDs which revealed candida esophagitis. She was treated with oral diflucan. States states the last EGD did not go well and she does not want to repeat this. She reports she has had recurrent symptoms for the last 3-4 weeks. She has not followed up with her GI or PCP. States she has increased bowel movements, sore throat, and irritation to her tongue. She is unsure of prior workup to determine cause of recurrent candida infections. Prior medical records have been requested. She also reports recurrent hypomagnesemia, on repletion therapy.        Relevant past medical, surgical, family, and social history reviewed and updated as indicated.  Allergies and medications reviewed and updated. Data reviewed: Chart in Epic.   Past Medical History:  Diagnosis Date   Hyperlipidemia    Hypertension    Yeast infection     Past Surgical History:  Procedure Laterality  Date   ABDOMINAL HYSTERECTOMY     SPINE SURGERY      Social History   Socioeconomic History   Marital status: Divorced    Spouse name: Not on file   Number of children: Not on file   Years of education: Not on file   Highest education level: Not on file  Occupational History   Not on file  Tobacco Use   Smoking status: Every Day    Packs/day: 1.00    Years: 49.00    Total pack years: 49.00    Types: Cigarettes   Smokeless tobacco: Never  Substance and Sexual Activity   Alcohol use: Not Currently   Drug use: Not Currently   Sexual activity: Not Currently  Other Topics Concern   Not on file  Social History Narrative   Not on file   Social Determinants of Health   Financial Resource Strain: Not on file  Food Insecurity: Not on file  Transportation Needs: Not on file  Physical Activity: Not on file  Stress: Not on file  Social Connections: Not on file  Intimate Partner Violence: Not on file    Outpatient Encounter Medications as of 06/02/2022  Medication Sig   albuterol (ACCUNEB) 0.63 MG/3ML nebulizer solution Take 1 ampule by nebulization every 6 (six) hours as needed for Wheezing.   albuterol (VENTOLIN HFA) 108 (90 Base) MCG/ACT inhaler Inhale into the lungs.   amLODipine (NORVASC) 2.5 MG tablet Take 1 tablet by mouth daily.   chlorthalidone (HYGROTON) 25 MG tablet Take by mouth.   Cholecalciferol (VITAMIN D-1000 MAX  ST) 25 MCG (1000 UT) tablet Take by mouth.   estradiol (ESTRACE) 0.5 MG tablet Take by mouth.   estrogens, conjugated, (PREMARIN) 0.625 MG tablet Take by mouth.   ezetimibe (ZETIA) 10 MG tablet Take 1 tablet by mouth daily.   fluconazole (DIFLUCAN) 150 MG tablet 1 po q week x 4 weeks   fluticasone-salmeterol (ADVAIR HFA) 115-21 MCG/ACT inhaler Inhale 2 puffs into the lungs 2 (two) times daily.   HYDROcodone-acetaminophen (NORCO/VICODIN) 5-325 MG tablet Take 1 tablet by mouth 3 (three) times daily as needed.   magnesium oxide (MAG-OX) 400 MG tablet Take  by mouth.   meloxicam (MOBIC) 7.5 MG tablet Take 1 tablet (7.5 mg total) by mouth daily.   omeprazole (PRILOSEC) 20 MG capsule Take by mouth.   [DISCONTINUED] oxyCODONE-acetaminophen (PERCOCET/ROXICET) 5-325 MG tablet Take by mouth.   No facility-administered encounter medications on file as of 06/02/2022.    Allergies  Allergen Reactions   Amlodipine Besylate     Other reaction(s): dizzy, ankle swelling   Atorvastatin     Other reaction(s): reflux   Hydrochlorothiazide     Other reaction(s): throat swelling, tongue raw   Levofloxacin     Other reaction(s): joint pain   Losartan Potassium     Other reaction(s): cough, nausea, chokes her   Pravastatin     Other reaction(s): hurts all over, joints muscles   Rosuvastatin     Other reaction(s): nause, muscle weakness, pains in body   Lisinopril Cough   Tetracycline Hcl Other (See Comments)    Severe yeast infection   Tetracyclines & Related Other (See Comments)    Severe yeast infection    Review of Systems  Constitutional:  Positive for activity change and appetite change. Negative for chills, diaphoresis, fatigue, fever and unexpected weight change.  HENT:  Positive for sore throat and trouble swallowing. Negative for congestion, dental problem, drooling, ear discharge, ear pain, facial swelling, hearing loss, mouth sores, nosebleeds, postnasal drip, rhinorrhea, sinus pressure, sinus pain, sneezing, tinnitus and voice change.   Eyes: Negative.  Negative for photophobia and visual disturbance.  Respiratory:  Positive for cough and wheezing. Negative for chest tightness and shortness of breath.   Cardiovascular:  Negative for chest pain, palpitations and leg swelling.  Gastrointestinal:  Negative for abdominal distention, abdominal pain, anal bleeding, blood in stool, constipation, diarrhea, nausea, rectal pain and vomiting.  Endocrine: Negative.   Genitourinary:  Negative for decreased urine volume, difficulty urinating, dysuria,  frequency and urgency.  Musculoskeletal:  Positive for arthralgias, back pain and myalgias.  Skin: Negative.   Allergic/Immunologic: Negative.   Neurological:  Negative for dizziness, weakness and headaches.  Hematological: Negative.   Psychiatric/Behavioral:  Negative for confusion, hallucinations, sleep disturbance and suicidal ideas.   All other systems reviewed and are negative.       Objective:  BP 97/66   Pulse 88   Temp 98.2 F (36.8 C)   Ht '5\' 2"'  (1.575 m)   Wt 129 lb (58.5 kg)   SpO2 93%   BMI 23.59 kg/m    Wt Readings from Last 3 Encounters:  06/02/22 129 lb (58.5 kg)    Physical Exam Vitals and nursing note reviewed.  Constitutional:      General: She is not in acute distress.    Appearance: Normal appearance. She is normal weight. She is not ill-appearing, toxic-appearing or diaphoretic.  HENT:     Head: Normocephalic and atraumatic.     Nose: Nose normal.     Mouth/Throat:  Lips: Pink.     Mouth: Mucous membranes are moist.     Tongue: Tongue lesions: yellowish-white patches on tongue.     Pharynx: Posterior oropharyngeal erythema present. No pharyngeal swelling, oropharyngeal exudate or uvula swelling.  Eyes:     Conjunctiva/sclera: Conjunctivae normal.     Pupils: Pupils are equal, round, and reactive to light.  Cardiovascular:     Rate and Rhythm: Normal rate and regular rhythm.     Heart sounds: Normal heart sounds.  Pulmonary:     Effort: Pulmonary effort is normal.     Breath sounds: Wheezing and rhonchi (scattered at bases, clears with cough) present.  Abdominal:     General: Bowel sounds are normal. There is no distension.  Musculoskeletal:     Right lower leg: No edema.     Left lower leg: No edema.  Skin:    General: Skin is warm and dry.     Capillary Refill: Capillary refill takes less than 2 seconds.  Neurological:     General: No focal deficit present.     Mental Status: She is alert and oriented to person, place, and time.   Psychiatric:        Mood and Affect: Mood normal.        Behavior: Behavior normal.        Thought Content: Thought content normal.        Judgment: Judgment normal.     Results for orders placed or performed during the hospital encounter of 02/08/20  SARS CORONAVIRUS 2 (TAT 6-24 HRS) Nasopharyngeal Nasopharyngeal Swab   Specimen: Nasopharyngeal Swab  Result Value Ref Range   SARS Coronavirus 2 NEGATIVE NEGATIVE       Pertinent labs & imaging results that were available during my care of the patient were reviewed by me and considered in my medical decision making.  Assessment & Plan:  [place] was seen today for new patient (initial visit).  Diagnoses and all orders for this visit:  Candida esophagitis ([place]) Thrush, oral Need to determine cause of recurrent candida infections. Concerning for undiagnosed DM or HIV. Will review prior medical records once received and determine if additional testing is warranted. Will treat with once weekly diflucan for 4 weeks, 1 pill every 7 days x 4.  -     fluconazole (DIFLUCAN) 150 MG tablet; 1 po q week x 4 weeks -     CMP14+EGFR -     CBC with Differential/Platelet  Hypomagnesemia Will check levels today and adjust repletion therapy if warranted.  -     Magnesium  Low serum vitamin D Labs pending. Continue repletion therapy. If indicated, will change repletion dosage. Eat foods rich in Vit D including milk, orange juice, yogurt with vitamin D added, salmon or mackerel, canned tuna fish, cereals with vitamin D added, and cod liver oil. Get out in the sun but make sure to wear at least SPF 30 sunscreen.  -     VITAMIN D 25 Hydroxy (Vit-D Deficiency, Fractures)  Primary hypertension Low normal today. Will recheck at next visit and adjust regimen if warranted. DASH diet and exercise encouraged. -     CMP14+EGFR -     CBC with Differential/Platelet  Primary osteoarthritis involving multiple joints Followed by pain management. Will trial  mobic to see if beneficial for arthritic pain.  -     meloxicam (MOBIC) 7.5 MG tablet; Take 1 tablet (7.5 mg total) by mouth daily.     Continue all other maintenance medications.  Follow up plan: Return in 6 weeks (on 07/14/2022), or if symptoms worsen or fail to improve, for CPE with labs.   Continue healthy lifestyle choices, including diet (rich in fruits, vegetables, and lean proteins, and low in salt and simple carbohydrates) and exercise (at least 30 minutes of moderate physical activity daily).  Educational handout given for esophagitis, thrush  The above assessment and management plan was discussed with the patient. The patient verbalized understanding of and has agreed to the management plan. Patient is aware to call the clinic if they develop any new symptoms or if symptoms persist or worsen. Patient is aware when to return to the clinic for a follow-up visit. Patient educated on when it is appropriate to go to the emergency department.   Monia Pouch, FNP-C [facility] [phone]

## 2022-06-03 ENCOUNTER — Telehealth: Payer: Self-pay | Admitting: Family Medicine

## 2022-06-03 LAB — CMP14+EGFR
ALT: 16 IU/L (ref 0–32)
AST: 19 IU/L (ref 0–40)
Albumin/Globulin Ratio: 2 (ref 1.2–2.2)
Albumin: 4.8 g/dL (ref 3.8–4.8)
Alkaline Phosphatase: 105 IU/L (ref 44–121)
BUN/Creatinine Ratio: 8 — ABNORMAL LOW (ref 12–28)
BUN: 8 mg/dL (ref 8–27)
Bilirubin Total: 0.3 mg/dL (ref 0.0–1.2)
CO2: 30 mmol/L — ABNORMAL HIGH (ref 20–29)
Calcium: 10 mg/dL (ref 8.7–10.3)
Chloride: 95 mmol/L — ABNORMAL LOW (ref 96–106)
Creatinine, Ser: 1.03 mg/dL — ABNORMAL HIGH (ref 0.57–1.00)
Globulin, Total: 2.4 g/dL (ref 1.5–4.5)
Glucose: 85 mg/dL (ref 70–99)
Potassium: 4.1 mmol/L (ref 3.5–5.2)
Sodium: 136 mmol/L (ref 134–144)
Total Protein: 7.2 g/dL (ref 6.0–8.5)
eGFR: 58 mL/min/{1.73_m2} — ABNORMAL LOW (ref >59)

## 2022-06-03 LAB — MAGNESIUM: Magnesium: 1.9 mg/dL (ref 1.6–2.3)

## 2022-06-03 LAB — CBC WITH DIFFERENTIAL/PLATELET
Basophils Absolute: 0.1 10*3/uL (ref 0.0–0.2)
Basos: 1 %
EOS (ABSOLUTE): 0.2 10*3/uL (ref 0.0–0.4)
Eos: 2 %
Hematocrit: 45.4 % (ref 34.0–46.6)
Hemoglobin: 15.7 g/dL (ref 11.1–15.9)
Immature Grans (Abs): 0 10*3/uL (ref 0.0–0.1)
Immature Granulocytes: 0 %
Lymphocytes Absolute: 3 10*3/uL (ref 0.7–3.1)
Lymphs: 34 %
MCH: 31 pg (ref 26.6–33.0)
MCHC: 34.6 g/dL (ref 31.5–35.7)
MCV: 90 fL (ref 79–97)
Monocytes Absolute: 1 10*3/uL — ABNORMAL HIGH (ref 0.1–0.9)
Monocytes: 11 %
Neutrophils Absolute: 4.6 10*3/uL (ref 1.4–7.0)
Neutrophils: 52 %
Platelets: 221 10*3/uL (ref 150–450)
RBC: 5.07 x10E6/uL (ref 3.77–5.28)
RDW: 14 % (ref 11.7–15.4)
WBC: 8.9 10*3/uL (ref 3.4–10.8)

## 2022-06-03 LAB — VITAMIN D 25 HYDROXY (VIT D DEFICIENCY, FRACTURES): Vit D, 25-Hydroxy: 53.7 ng/mL (ref 30.0–100.0)

## 2022-06-03 NOTE — Telephone Encounter (Signed)
Called and explained direction to patient she said that that was not corrected you told her to take it for 7 days days straight. I told her per the note it was one pill every 7 days. Patient is upset and said that is not right at all she needs to be on it for 7 days.

## 2022-06-03 NOTE — Telephone Encounter (Signed)
Patient aware and verbalized understanding. °

## 2022-06-29 ENCOUNTER — Other Ambulatory Visit: Payer: Self-pay | Admitting: Family Medicine

## 2022-06-29 DIAGNOSIS — M159 Polyosteoarthritis, unspecified: Secondary | ICD-10-CM

## 2022-07-16 ENCOUNTER — Other Ambulatory Visit: Payer: Self-pay | Admitting: Nurse Practitioner

## 2022-07-16 ENCOUNTER — Other Ambulatory Visit (HOSPITAL_COMMUNITY): Payer: Self-pay | Admitting: Nurse Practitioner

## 2022-07-16 DIAGNOSIS — Z87891 Personal history of nicotine dependence: Secondary | ICD-10-CM

## 2022-07-20 ENCOUNTER — Encounter: Payer: Managed Care, Other (non HMO) | Admitting: Family Medicine

## 2022-08-15 ENCOUNTER — Other Ambulatory Visit: Payer: Self-pay | Admitting: Family Medicine

## 2022-08-15 DIAGNOSIS — B3781 Candidal esophagitis: Secondary | ICD-10-CM

## 2022-08-15 DIAGNOSIS — B370 Candidal stomatitis: Secondary | ICD-10-CM

## 2022-09-29 ENCOUNTER — Ambulatory Visit (HOSPITAL_COMMUNITY): Payer: Medicare Other

## 2022-09-29 ENCOUNTER — Encounter (HOSPITAL_COMMUNITY): Payer: Self-pay

## 2023-01-14 ENCOUNTER — Other Ambulatory Visit (HOSPITAL_COMMUNITY): Payer: Self-pay | Admitting: Adult Health Nurse Practitioner

## 2023-01-14 DIAGNOSIS — R911 Solitary pulmonary nodule: Secondary | ICD-10-CM

## 2023-03-09 ENCOUNTER — Ambulatory Visit (HOSPITAL_COMMUNITY)
Admission: RE | Admit: 2023-03-09 | Discharge: 2023-03-09 | Disposition: A | Discharge status: Home / Self Care | Payer: Medicare Other | Source: Ambulatory Visit | Attending: Adult Health Nurse Practitioner | Admitting: Adult Health Nurse Practitioner

## 2023-03-09 DIAGNOSIS — I700 Atherosclerosis of aorta: Secondary | ICD-10-CM | POA: Diagnosis not present

## 2023-03-09 DIAGNOSIS — Z122 Encounter for screening for malignant neoplasm of respiratory organs: Secondary | ICD-10-CM | POA: Diagnosis not present

## 2023-03-09 DIAGNOSIS — R911 Solitary pulmonary nodule: Secondary | ICD-10-CM

## 2023-03-09 DIAGNOSIS — J432 Centrilobular emphysema: Secondary | ICD-10-CM | POA: Diagnosis not present

## 2023-03-09 DIAGNOSIS — F17210 Nicotine dependence, cigarettes, uncomplicated: Secondary | ICD-10-CM | POA: Insufficient documentation

## 2023-03-09 DIAGNOSIS — I2510 Atherosclerotic heart disease of native coronary artery without angina pectoris: Secondary | ICD-10-CM | POA: Diagnosis not present

## 2023-10-18 ENCOUNTER — Other Ambulatory Visit: Payer: Self-pay | Admitting: *Deleted

## 2023-10-18 DIAGNOSIS — Z87891 Personal history of nicotine dependence: Secondary | ICD-10-CM

## 2023-10-18 DIAGNOSIS — Z122 Encounter for screening for malignant neoplasm of respiratory organs: Secondary | ICD-10-CM

## 2023-10-18 DIAGNOSIS — F17210 Nicotine dependence, cigarettes, uncomplicated: Secondary | ICD-10-CM

## 2024-04-11 ENCOUNTER — Ambulatory Visit (HOSPITAL_COMMUNITY): Admission: RE | Admit: 2024-04-11 | Source: Ambulatory Visit
